# Patient Record
Sex: Male | Born: 1995
Health system: Southern US, Community
[De-identification: ages and names within clinical notes are randomized; demographics above are authoritative.]

## PROBLEM LIST (undated history)

## (undated) ENCOUNTER — Emergency Department (HOSPITAL_COMMUNITY): Admission: EM | Payer: Self-pay | Source: Home / Self Care

## (undated) HISTORY — PX: BRAIN SURGERY: SHX531

## (undated) HISTORY — PX: FRACTURE SURGERY: SHX138

---
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
## 2014-07-27 ENCOUNTER — Emergency Department (HOSPITAL_COMMUNITY)
Admission: EM | Admit: 2014-07-27 | Discharge: 2014-07-27 | Disposition: A | Discharge status: Home / Self Care | Payer: Self-pay | Attending: Emergency Medicine | Admitting: Emergency Medicine

## 2014-07-27 ENCOUNTER — Encounter (HOSPITAL_COMMUNITY): Payer: Self-pay | Admitting: *Deleted

## 2014-07-27 DIAGNOSIS — Z720 Tobacco use: Secondary | ICD-10-CM | POA: Insufficient documentation

## 2014-07-27 DIAGNOSIS — R000 Tachycardia, unspecified: Secondary | ICD-10-CM | POA: Insufficient documentation

## 2014-07-27 DIAGNOSIS — L0291 Cutaneous abscess, unspecified: Secondary | ICD-10-CM

## 2014-07-27 DIAGNOSIS — L03112 Cellulitis of left axilla: Secondary | ICD-10-CM | POA: Insufficient documentation

## 2014-07-27 DIAGNOSIS — L03119 Cellulitis of unspecified part of limb: Secondary | ICD-10-CM

## 2014-07-27 DIAGNOSIS — L03311 Cellulitis of abdominal wall: Secondary | ICD-10-CM | POA: Insufficient documentation

## 2014-07-27 DIAGNOSIS — L03113 Cellulitis of right upper limb: Secondary | ICD-10-CM | POA: Insufficient documentation

## 2014-07-27 MED ORDER — LIDOCAINE HCL (PF) 1 % IJ SOLN
INTRAMUSCULAR | Status: AC
  Administered 2014-07-27: 5 mL
  Filled 2014-07-27: qty 5

## 2014-07-27 MED ORDER — HYDROCODONE-ACETAMINOPHEN 5-325 MG PO TABS
ORAL_TABLET | ORAL | Status: AC
  Filled 2014-07-27: qty 2

## 2014-07-27 MED ORDER — SULFAMETHOXAZOLE-TRIMETHOPRIM 800-160 MG PO TABS: 1.0000 | ORAL_TABLET | Freq: Two times a day (BID) | ORAL | Status: AC

## 2014-07-27 MED ORDER — HYDROCODONE-ACETAMINOPHEN 5-325 MG PO TABS: 1.0000 | ORAL_TABLET | ORAL | Status: DC | PRN

## 2014-07-27 MED ORDER — SULFAMETHOXAZOLE-TRIMETHOPRIM 800-160 MG PO TABS
1.0000 | ORAL_TABLET | Freq: Once | ORAL | Status: AC
  Administered 2014-07-27: 1 via ORAL

## 2014-07-27 MED ORDER — IBUPROFEN 800 MG PO TABS: 800.0000 mg | ORAL_TABLET | Freq: Three times a day (TID) | ORAL | Status: DC

## 2014-07-27 MED ORDER — HYDROCODONE-ACETAMINOPHEN 5-325 MG PO TABS
2.0000 | ORAL_TABLET | Freq: Once | ORAL | Status: AC
  Administered 2014-07-27: 2 via ORAL

## 2014-07-27 MED ORDER — CEFTRIAXONE SODIUM 1 G IJ SOLR
INTRAMUSCULAR | Status: AC
  Filled 2014-07-27: qty 10

## 2014-07-27 MED ORDER — CIPROFLOXACIN HCL 500 MG PO TABS: 500.0000 mg | ORAL_TABLET | Freq: Two times a day (BID) | ORAL | Status: DC

## 2014-07-27 MED ORDER — IBUPROFEN 800 MG PO TABS
800.0000 mg | ORAL_TABLET | Freq: Once | ORAL | Status: AC
  Administered 2014-07-27: 800 mg via ORAL

## 2014-07-27 MED ORDER — SULFAMETHOXAZOLE-TRIMETHOPRIM 800-160 MG PO TABS
ORAL_TABLET | ORAL | Status: AC
  Filled 2014-07-27: qty 1

## 2014-07-27 MED ORDER — IBUPROFEN 800 MG PO TABS
ORAL_TABLET | ORAL | Status: AC
  Filled 2014-07-27: qty 1

## 2014-07-27 MED ORDER — CEFTRIAXONE SODIUM 1 G IJ SOLR
1.0000 g | Freq: Once | INTRAMUSCULAR | Status: AC
  Administered 2014-07-27: 1 g via INTRAMUSCULAR

## 2014-07-27 NOTE — Discharge Instructions (Signed)
Cellulitis  15 minutes daily until the wounds of your elbow, your arm, and on your side had healed. Please usual medications as ordered. Please see your primary physician, or return to the emergency department on February 26 for recheck of your cellulitis area of the elbow. Please change the dressing daily.                                                                                                                                                                       Cellulitis is an infection of the skin and the tissue under the skin. The infected area is usually red and tender. This happens most often in the arms and lower legs. HOME CARE   Take your antibiotic medicine as told. Finish the medicine even if you start to feel better.  Keep the infected arm or leg raised (elevated).  Put a warm cloth on the area up to 4 times per day.  Only take medicines as told by your doctor.  Keep all doctor visits as told. GET HELP IF:  You see red streaks on the skin coming from the infected area.  Your red area gets bigger or turns a dark color.  Your bone or joint under the infected area is painful after the skin heals.  Your infection comes back in the same area or different area.  You have a puffy (swollen) bump in the infected area.  You have new symptoms.  You have a fever. GET HELP RIGHT AWAY IF:   You feel very sleepy.  You throw up (vomit) or have watery poop (diarrhea).  You feel sick and have muscle aches and pains. MAKE SURE YOU:   Understand these instructions.  Will watch your condition.  Will get help right away if you are not doing well or get worse. Document Released: 11/07/2007 Document Revised: 10/05/2013 Document Reviewed: 08/06/2011 [facility] Patient Information 2015 [place], [place]. This information is not intended to replace advice given to you by your health care provider. Make sure you discuss any questions you have with your health care  provider.  Abscess An abscess (boil or furuncle) is an infected area on or under the skin. This area is filled with yellowish-white fluid (pus) and other material (debris). HOME CARE   Only take medicines as told by your doctor.  If you were given antibiotic medicine, take it as directed. Finish the medicine even if you start to feel better.  If gauze is used, follow your doctor's directions for changing the gauze.  To avoid spreading the infection:  Keep your abscess covered with a bandage.  Wash your hands well.  Do not share personal care items, towels, or whirlpools with others.  Avoid skin contact with others.  Keep  your skin and clothes clean around the abscess.  Keep all doctor visits as told. GET HELP RIGHT AWAY IF:   You have more pain, puffiness (swelling), or redness in the wound site.  You have more fluid or blood coming from the wound site.  You have muscle aches, chills, or you feel sick.  You have a fever. MAKE SURE YOU:   Understand these instructions.  Will watch your condition.  Will get help right away if you are not doing well or get worse. Document Released: 11/07/2007 Document Revised: 11/20/2011 Document Reviewed: 08/03/2011 [facility] Patient Information 2015 [place]. This information is not intended to replace advice given to you by your health care provider. Make sure you discuss any questions you have with your health care provider.

## 2014-07-27 NOTE — ED Notes (Addendum)
Pain, redness , swelling of rt elbow, with  Raised  Abscesses to lt chest and  And lt upper arm  Purulent drainage from rt elbow

## 2014-07-27 NOTE — ED Provider Notes (Signed)
CSN: [id number]     Arrival date & time 07/27/14  1125 History   First MD Initiated Contact with Patient 07/27/14 1200     Chief Complaint  Patient presents with  . Cellulitis     (Consider location/radiation/quality/duration/timing/severity/associated sxs/prior Treatment) HPI Comments: Patient is a 19 year old male who presents to the emergency department with a complaint of infected bumps at multiple places.  The patient states that approximately 2 weeks ago he noticed a red area on his right elbow. He attempted to squeeze it and open it, because he has done this before when these areas, up. He states now he has some swelling of the elbow and tenderness when he attempts to use his right arm. The patient has also noticed some smaller areas under the left arm, and also on the left flank area. The patient states he thinks he has had some temperature elevation, but he is not sure. He denies any shaking chills. He has not had nausea or vomiting. The patient states he has had these kinds of problems before, he is unsure if he's ever been diagnosed with methicillin-resistant staph arias.   The history is provided by the patient.    History reviewed. No pertinent past medical history. Past Surgical History  Procedure Laterality Date  . Brain surgery    . Fracture surgery     History reviewed. No pertinent family history. History  Substance Use Topics  . Smoking status: Current Every Day Smoker  . Smokeless tobacco: Not on file  . Alcohol Use: No    Review of Systems  Constitutional: Negative for activity change.       All ROS Neg except as noted in HPI  HENT: Negative for nosebleeds.   Eyes: Negative for photophobia and discharge.  Respiratory: Negative for cough, shortness of breath and wheezing.   Cardiovascular: Negative for chest pain and palpitations.  Gastrointestinal: Negative for abdominal pain and blood in stool.  Genitourinary: Negative for dysuria, frequency and hematuria.   Musculoskeletal: Negative for arthralgias and neck pain.  Skin: Positive for wound.  Neurological: Negative for dizziness, seizures and speech difficulty.  Psychiatric/Behavioral: Negative for hallucinations and confusion.      Allergies  Review of patient's allergies indicates no known allergies.  Home Medications   Prior to Admission medications   Not on File   BP 146/72 mmHg  Pulse 105  Temp(Src) 98.6 F (37 C) (Oral)  Resp 20  Ht 6' (1.829 m)  Wt 270 lb (122.471 kg)  BMI 36.61 kg/m2  SpO2 97% Physical Exam  Constitutional: He is oriented to person, place, and time. He appears well-developed and well-nourished.  Non-toxic appearance.  HENT:  Head: Normocephalic.  Right Ear: Tympanic membrane and external ear normal.  Left Ear: Tympanic membrane and external ear normal.  Eyes: EOM and lids are normal. Pupils are equal, round, and reactive to light.  Neck: Normal range of motion. Neck supple. Carotid bruit is not present.  Cardiovascular: Regular rhythm, normal heart sounds, intact distal pulses and normal pulses.  Tachycardia present.   Pulmonary/Chest: Breath sounds normal. No respiratory distress.  Abdominal: Soft. Bowel sounds are normal. There is no tenderness. There is no guarding.  Musculoskeletal: Normal range of motion.  There is a scabbed abscess area of the right elbow. There is increased redness around the area. There is no red streaking. But the elbow area is warm to touch. There is soreness with pronation and supination, as well as flexion and extension. There is full range  of motion of the shoulder, wrist, and fingers of the right hand.  Lymphadenopathy:       Head (right side): No submandibular adenopathy present.       Head (left side): No submandibular adenopathy present.    He has no cervical adenopathy.  Neurological: He is alert and oriented to person, place, and time. He has normal strength. No cranial nerve deficit or sensory deficit.  Skin: Skin  is warm and dry.  There are 2 raised red lesions of the left axilla, and 1 red raised abscess area of the left flank area. There no red streaks appreciated. These areas are tender to palpation.  Psychiatric: He has a normal mood and affect. His speech is normal.  Nursing note and vitals reviewed.   ED Course  Procedures (including critical care time) Labs Review Labs Reviewed - No data to display  Imaging Review No results found.   EKG Interpretation None      MDM  The examination suggest some early cellulitis involving the right elbow. The small abscess areas of the axilla and the left flank area are not candidates for incision and drainage. The pulse rate is 105, otherwise the vital signs are within normal limits. The patient is ambulatory without problem.  The plan at this time is for the patient to be treated with intramuscular Rocephin here in the emergency department. A prescription for Bactrim and Cipro given to the patient. Prescription for Norco given for use with pain. Patient advised to use warm tub soaks. He is to return in 3 days for recheck of the right elbow.   Final diagnoses:  None    *I have reviewed nursing notes, vital signs, and all appropriate lab and imaging results for this patient.**    Kathie Dike, PA-C 07/27/14 1346  Samuel Jester, DO 07/28/14 [phone]

## 2014-08-24 ENCOUNTER — Encounter (HOSPITAL_COMMUNITY): Payer: Self-pay | Admitting: *Deleted

## 2014-08-24 ENCOUNTER — Emergency Department (HOSPITAL_COMMUNITY)
Admission: EM | Admit: 2014-08-24 | Discharge: 2014-08-24 | Disposition: A | Discharge status: Home / Self Care | Payer: Self-pay | Attending: Emergency Medicine | Admitting: Emergency Medicine

## 2014-08-24 DIAGNOSIS — Z720 Tobacco use: Secondary | ICD-10-CM | POA: Insufficient documentation

## 2014-08-24 DIAGNOSIS — L0501 Pilonidal cyst with abscess: Secondary | ICD-10-CM

## 2014-08-24 DIAGNOSIS — Z792 Long term (current) use of antibiotics: Secondary | ICD-10-CM | POA: Insufficient documentation

## 2014-08-24 DIAGNOSIS — L0502 Pilonidal sinus with abscess: Secondary | ICD-10-CM | POA: Insufficient documentation

## 2014-08-24 MED ORDER — OXYCODONE-ACETAMINOPHEN 5-325 MG PO TABS
1.0000 | ORAL_TABLET | Freq: Once | ORAL | Status: AC
  Administered 2014-08-24: 1 via ORAL
  Filled 2014-08-24: qty 1

## 2014-08-24 MED ORDER — HYDROCODONE-ACETAMINOPHEN 5-325 MG PO TABS: 1.0000 | ORAL_TABLET | ORAL | Status: DC | PRN

## 2014-08-24 MED ORDER — LIDOCAINE HCL (PF) 1 % IJ SOLN
10.0000 mL | Freq: Once | INTRAMUSCULAR | Status: AC
  Administered 2014-08-24: 10 mL
  Filled 2014-08-24: qty 10

## 2014-08-24 MED ORDER — SULFAMETHOXAZOLE-TRIMETHOPRIM 800-160 MG PO TABS: 1.0000 | ORAL_TABLET | Freq: Two times a day (BID) | ORAL | Status: DC

## 2014-08-24 NOTE — Discharge Instructions (Signed)
°  Pilonidal Cyst, Care After °A pilonidal cyst occurs when hairs get trapped (ingrown) beneath the skin in the crease between the buttocks over your sacrum (the bone under that crease). Pilonidal cysts are most common in young men with a lot of body hair. When the cyst breaks(ruptured) or leaks, fluid from the cyst may cause burning and itching. If the cyst becomes infected, it causes a painful swelling filled with pus (abscess). The pus and trapped hairs need to be removed (often by lancing) so that the infection can heal. The word pilonidal means hair nest. °HOME CARE INSTRUCTIONS °If the pilonidal sinus was NOT DRAINING OR LANCED: °· Keep the area clean and dry. Bathe or shower daily. Wash the area well with a germ-killing soap. Hot tub baths may help prevent infection. Dry the area well with a towel. °· Avoid tight clothing in order to keep area as moisture-free as possible. °· Keep area between buttocks as free from hair as possible. A depilatory may be used. °· Take antibiotics as directed. °· Only take over-the-counter or prescription medicines for pain, discomfort, or fever as directed by your caregiver. °If the cyst WAS INFECTED AND NEEDED TO BE DRAINED: °· Your caregiver may have packed the wound with gauze to keep the wound open. This allows the wound to heal from the inside outward and continue to drain. °· Return as directed for a wound check. °· If you take tub baths or showers, repack the wound with gauze as directed following. Sponge baths are a good alternative. Sitz baths may be used three to four times a day or as directed. °· If an antibiotic was ordered to fight the infection, take as directed. °· Only take over-the-counter or prescription medicines for pain, discomfort, or fever as directed by your caregiver. °· If a drain was in place and removed, use sitz baths for 20 minutes 4 times per day. Clean the wound gently with mild unscented soap, pat dry, and then apply a dry dressing as  directed. °If you had surgery and IT WAS MARSUPIALIZED (LEFT OPEN): °· Your wound was packed with gauze to keep the wound open. This allows the wound to heal from the inside outwards and continue draining. The changing of the dressing regularly also helps keep the wound clean. °· Return as directed for a wound check. °· If you take tub baths or showers, repack the wound with gauze as directed following. Sponge baths are a good alternative. Sitz baths can also be used. This may be done three to four times a day or as directed. °· If an antibiotic was ordered to fight the infection, take as directed. °· Only take over-the-counter or prescription medicines for pain, discomfort, or fever as directed by your caregiver. °· If you had surgery and the wound was closed you may care for it as directed. This generally includes keeping it dry and clean and dressing it as directed. °SEEK MEDICAL CARE IF:  °· You have increased pain, swelling, redness, drainage, or bleeding from the area. °· You have a fever. °· You have muscles aches, dizziness, or a general ill feeling. °Document Released: 06/21/2006 Document Revised: 01/21/2013 Document Reviewed: 09/05/2006 °Exit[employer]® Patient Information ©2015 [employer]. This information is not intended to replace advice given to you by your health care provider. Make sure you discuss any questions you have with your health care provider. ° ° °

## 2014-08-24 NOTE — ED Notes (Signed)
Red swollen area to buttocks.

## 2014-08-25 NOTE — ED Provider Notes (Signed)
CSN: [id number]     Arrival date & time 08/24/14  1412 History   First MD Initiated Contact with Patient 08/24/14 1502     Chief Complaint  Patient presents with  . Cyst     (Consider location/radiation/quality/duration/timing/severity/associated sxs/prior Treatment) The history is provided by the patient.   Philip Bird is a 19 y.o. male with a 1 day history of tender, swollen, reddened area midline buttocks.  He endorses history of several prior abscess, right elbow, anterior groin, new at the current site.  There has been no drainage from the site despite warm soaks.  He denies fevers, chills, nausea, otherwise feels well.  No personal for close contact h/o mrsa.     History reviewed. No pertinent past medical history. Past Surgical History  Procedure Laterality Date  . Brain surgery    . Fracture surgery     History reviewed. No pertinent family history. History  Substance Use Topics  . Smoking status: Current Every Day Smoker    Types: Cigarettes  . Smokeless tobacco: Not on file  . Alcohol Use: No    Review of Systems  Constitutional: Negative for fever, chills and fatigue.  Gastrointestinal: Negative for nausea.  Musculoskeletal: Negative.   Skin: Positive for color change.  Neurological: Negative for numbness.      Allergies  Review of patient's allergies indicates no known allergies.  Home Medications   Prior to Admission medications   Medication Sig Start Date End Date Taking? Authorizing Provider  ciprofloxacin (CIPRO) 500 MG tablet Take 1 tablet (500 mg total) by mouth 2 (two) times daily. Patient not taking: Reported on 08/24/2014 07/27/14   Ivery QualeHobson Bryant, PA-C  HYDROcodone-acetaminophen (NORCO/VICODIN) 5-325 MG per tablet Take 1 tablet by mouth every 4 (four) hours as needed. 08/24/14   Burgess AmorJulie Santina Trillo, PA-C  ibuprofen (ADVIL,MOTRIN) 800 MG tablet Take 1 tablet (800 mg total) by mouth 3 (three) times daily. Patient not taking: Reported on 08/24/2014 07/27/14    Ivery QualeHobson Bryant, PA-C  sulfamethoxazole-trimethoprim (SEPTRA DS) 800-160 MG per tablet Take 1 tablet by mouth every 12 (twelve) hours. 08/24/14   Burgess AmorJulie Araiya Tilmon, PA-C   BP 126/72 mmHg  Pulse 98  Temp(Src) 98.3 F (36.8 C) (Oral)  Resp 16  Ht 6' (1.829 m)  Wt 280 lb (127.007 kg)  BMI 37.97 kg/m2  SpO2 96% Physical Exam  Constitutional: He is oriented to person, place, and time. He appears well-developed and well-nourished.  HENT:  Head: Normocephalic.  Cardiovascular: Normal rate.   Pulmonary/Chest: Effort normal.  Neurological: He is alert and oriented to person, place, and time. No sensory deficit.  Skin: There is erythema.  approx 2 cm area of fluctuant tenderness midline at pilonidal site.  No drainage, slight surrounding erythema.    ED Course  INCISION AND DRAINAGE Date/Time: 08/24/2014 4:00 PM Performed by: Burgess AmorIDOL, Verta Riedlinger Authorized by: Burgess AmorIDOL, Kahle Mcqueen Consent: Verbal consent obtained. Risks and benefits: risks, benefits and alternatives were discussed Consent given by: patient Patient identity confirmed: verbally with patient Time out: Immediately prior to procedure a "time out" was called to verify the correct patient, procedure, equipment, support staff and site/side marked as required. Type: abscess Body area: anogenital Location details: pilonidal Anesthesia: local infiltration Local anesthetic: lidocaine 1% without epinephrine Anesthetic total: 4 ml Patient sedated: no Scalpel size: 11 Incision type: single straight Complexity: simple Drainage: purulent Drainage amount: moderate Wound treatment: wound left open Packing material: none Patient tolerance: Patient tolerated the procedure well with no immediate complications   (including critical care  time) Labs Review Labs Reviewed  CULTURE, ROUTINE-ABSCESS    Imaging Review No results found.   EKG Interpretation None      MDM   Final diagnoses:  Pilonidal cyst with abscess    Pt placed on bactrim,  advised aggressive warm soaks to keep site open and draining.  Hydrocodone for pain relief. Prn f/u here for recheck if not healing/improving as anticipated.  Wound culture sent.  The patient appears reasonably screened and/or stabilized for discharge and I doubt any other medical condition or other [facility] requiring further screening, evaluation, or treatment in the ED at this time prior to discharge.     Burgess Amor, PA-C 08/25/14 0600  Pricilla Loveless, MD 08/28/14 [phone]

## 2014-08-28 LAB — CULTURE, ROUTINE-ABSCESS

## 2014-08-29 ENCOUNTER — Telehealth: Payer: Self-pay | Admitting: Emergency Medicine

## 2014-08-29 NOTE — Progress Notes (Signed)
ED Antimicrobial Stewardship Positive Culture Follow Up   Philip OvermanHunter Bird is an 19 y.o. male who [facility] with a chief complaint of  Chief Complaint  Patient presents with  . Cyst    Recent Results (from the past 720 hour(s))  Culture, routine-abscess     Status: None   Collection Time: 08/24/14  4:07 PM  Result Value Ref Range Status   Specimen Description ABSCESS PILONIDAL  Final   Special Requests NONE  Final   Gram Stain   Final    ABUNDANT WBC PRESENT,BOTH PMN AND MONONUCLEAR NO SQUAMOUS EPITHELIAL CELLS SEEN ABUNDANT GRAM POSITIVE COCCI IN PAIRS [facility]    Culture   Final    RARE VIRIDANS STREPTOCOCCUS Performed at [facility]    Report Status 08/28/2014 FINAL  Final    [x]  Treated with Septra, organism resistant to prescribed antimicrobial  Viridans Streptococcus is not a common cause of skin infections or abscesses.  Discussed with Teressa LowerVrinda Pickering, FNP.  Flow manager to attempt to contact patient. If his abscess is better he should continue with Septra. If his abscess is not better he should add antibiotic therapy with Keflex.  New antibiotic prescription: Cephalexin 500mg  PO TID x 10 days   ED Provider: Teressa LowerVrinda Pickering, FNP   Sallee Provencalurner, Reizy Dunlow S 08/29/2014, 11:43 AM Infectious Diseases Pharmacist Phone# [phone]

## 2014-08-30 ENCOUNTER — Telehealth (HOSPITAL_COMMUNITY): Payer: Self-pay

## 2014-08-30 NOTE — ED Notes (Signed)
Spoke with pt. Informed of labs. Pt states he is doing better. No medication called in for pt.

## 2014-10-12 ENCOUNTER — Emergency Department (HOSPITAL_COMMUNITY)
Admission: EM | Admit: 2014-10-12 | Discharge: 2014-10-12 | Disposition: A | Discharge status: Home / Self Care | Payer: Self-pay | Attending: Emergency Medicine | Admitting: Emergency Medicine

## 2014-10-12 ENCOUNTER — Encounter (HOSPITAL_COMMUNITY): Payer: Self-pay

## 2014-10-12 DIAGNOSIS — Z720 Tobacco use: Secondary | ICD-10-CM | POA: Insufficient documentation

## 2014-10-12 DIAGNOSIS — L02412 Cutaneous abscess of left axilla: Secondary | ICD-10-CM | POA: Insufficient documentation

## 2014-10-12 MED ORDER — LIDOCAINE HCL (PF) 1 % IJ SOLN
INTRAMUSCULAR | Status: AC
  Administered 2014-10-12: 15:00:00
  Filled 2014-10-12: qty 5

## 2014-10-12 MED ORDER — CEPHALEXIN 500 MG PO CAPS: 500.0000 mg | ORAL_CAPSULE | Freq: Four times a day (QID) | ORAL | Status: DC

## 2014-10-12 MED ORDER — HYDROCODONE-ACETAMINOPHEN 5-325 MG PO TABS: 1.0000 | ORAL_TABLET | ORAL | Status: DC | PRN

## 2014-10-12 MED ORDER — POVIDONE-IODINE 10 % EX SOLN
CUTANEOUS | Status: AC
  Administered 2014-10-12: 15:00:00
  Filled 2014-10-12: qty 118

## 2014-10-12 MED ORDER — SULFAMETHOXAZOLE-TRIMETHOPRIM 800-160 MG PO TABS: 1.0000 | ORAL_TABLET | Freq: Two times a day (BID) | ORAL | Status: AC

## 2014-10-12 NOTE — ED Notes (Signed)
Boil to left axilla x5 days.

## 2014-10-12 NOTE — Discharge Instructions (Signed)

## 2014-10-14 NOTE — ED Provider Notes (Signed)
CSN: [id number]     Arrival date & time 10/12/14  1328 History   First MD Initiated Contact with Patient 10/12/14 1342     Chief Complaint  Patient presents with  . Recurrent Skin Infections     (Consider location/radiation/quality/duration/timing/severity/associated sxs/prior Treatment) Patient is a 19 y.o. male presenting with abscess. The history is provided by the patient.  Abscess Location:  Shoulder/arm Shoulder/arm abscess location:  L axilla Size:  2 cm Abscess quality: fluctuance, induration, painful and redness   Abscess quality: not draining   Red streaking: no   Duration:  5 days Progression:  Worsening Pain details:    Quality:  Sharp and throbbing   Severity:  Moderate   Duration:  3 days   Timing:  Constant   Progression:  Worsening Chronicity:  Recurrent (Pt has history of frequent abscesses, left axilla is a new site) Relieved by:  Nothing Worsened by:  Nothing tried Ineffective treatments:  Warm compresses and NSAIDs Associated symptoms: no fever, no nausea and no vomiting   Risk factors: prior abscess   Risk factors: no hx of MRSA     History reviewed. No pertinent past medical history. Past Surgical History  Procedure Laterality Date  . Brain surgery    . Fracture surgery     No family history on file. History  Substance Use Topics  . Smoking status: Current Every Day Smoker    Types: Cigarettes  . Smokeless tobacco: Not on file  . Alcohol Use: No    Review of Systems  Constitutional: Negative for fever and chills.  Respiratory: Negative for shortness of breath and wheezing.   Gastrointestinal: Negative for nausea and vomiting.  Skin: Positive for color change.       Negative except as mentioned in HPI.    Neurological: Negative for numbness.      Allergies  Review of patient's allergies indicates no known allergies.  Home Medications   Prior to Admission medications   Medication Sig Start Date End Date Taking? Authorizing  Provider  cephALEXin (KEFLEX) 500 MG capsule Take 1 capsule (500 mg total) by mouth 4 (four) times daily. 10/12/14   Burgess AmorJulie Arnol Mcgibbon, PA-C  ciprofloxacin (CIPRO) 500 MG tablet Take 1 tablet (500 mg total) by mouth 2 (two) times daily. Patient not taking: Reported on 08/24/2014 07/27/14   Ivery QualeHobson Bryant, PA-C  HYDROcodone-acetaminophen (NORCO/VICODIN) 5-325 MG per tablet Take 1 tablet by mouth every 4 (four) hours as needed. 10/12/14   Burgess AmorJulie Kendal Ghazarian, PA-C  ibuprofen (ADVIL,MOTRIN) 800 MG tablet Take 1 tablet (800 mg total) by mouth 3 (three) times daily. Patient not taking: Reported on 08/24/2014 07/27/14   Ivery QualeHobson Bryant, PA-C  sulfamethoxazole-trimethoprim (BACTRIM DS,SEPTRA DS) 800-160 MG per tablet Take 1 tablet by mouth 2 (two) times daily. 10/12/14 10/19/14  Burgess AmorJulie Adaja Wander, PA-C   BP 126/87 mmHg  Pulse 103  Temp(Src) 98.7 F (37.1 C) (Oral)  Resp 20  Ht 6' (1.829 m)  Wt 260 lb (117.935 kg)  BMI 35.25 kg/m2  SpO2 99% Physical Exam  Constitutional: He appears well-developed and well-nourished. No distress.  HENT:  Head: Normocephalic.  Neck: Neck supple.  Cardiovascular: Normal rate.   Pulmonary/Chest: Effort normal. He has no wheezes.  Musculoskeletal: Normal range of motion. He exhibits no edema.  Skin: Skin is warm and dry. There is erythema.  2 cm tender induration with a 4 cm total area round erythema at the site, no active drainage.     ED Course  Procedures (including critical care time)  INCISION AND DRAINAGE Performed by: Burgess AmorIDOL, Ritter Helsley Consent: Verbal consent obtained. Risks and benefits: risks, benefits and alternatives were discussed Type: abscess  Body area: left axilla  Anesthesia: local infiltration  Incision was made with a scalpel.  Local anesthetic: lidocaine 1% without epinephrine  Anesthetic total: 6  ml  Complexity: complex Blunt dissection to break up loculations  Drainage: purulent  Drainage amount: moderate  Packing material: no packing  Patient tolerance:  Patient tolerated the procedure well with no immediate complications.    Labs Review Labs Reviewed - No data to display  Imaging Review No results found.   EKG Interpretation None      MDM   Final diagnoses:  Abscess of axilla, left    Prior cultures reviewed, neg mrsa.  He was placed on bactrim and keflex, hydrocodone for pain relief.  Warm soaks bid followed by soap and water wash, dressing.  Recheck here or by pcp for any worsening or persistent sx.  The patient appears reasonably screened and/or stabilized for discharge and I doubt any other medical condition or other [facility] requiring further screening, evaluation, or treatment in the ED at this time prior to discharge.     Burgess AmorJulie Zubayr Bednarczyk, PA-C 10/14/14 1440  Eber HongBrian Miller, MD 10/14/14 2147

## 2016-01-26 ENCOUNTER — Other Ambulatory Visit: Payer: Self-pay | Admitting: Physical Medicine and Rehabilitation

## 2016-01-26 ENCOUNTER — Ambulatory Visit
Admission: RE | Admit: 2016-01-26 | Discharge: 2016-01-26 | Disposition: A | Discharge status: Home / Self Care | Payer: No Typology Code available for payment source | Source: Ambulatory Visit | Attending: Physical Medicine and Rehabilitation | Admitting: Physical Medicine and Rehabilitation

## 2016-01-26 DIAGNOSIS — Z0000 Encounter for general adult medical examination without abnormal findings: Secondary | ICD-10-CM

## 2017-03-25 IMAGING — CR DG CHEST 1V
1 series · 1 of 1 positions shown · non-contrast
Comparison: None.

CLINICAL DATA: Routine physical examination.

EXAM:
CHEST 1 VIEW

[view not recorded]
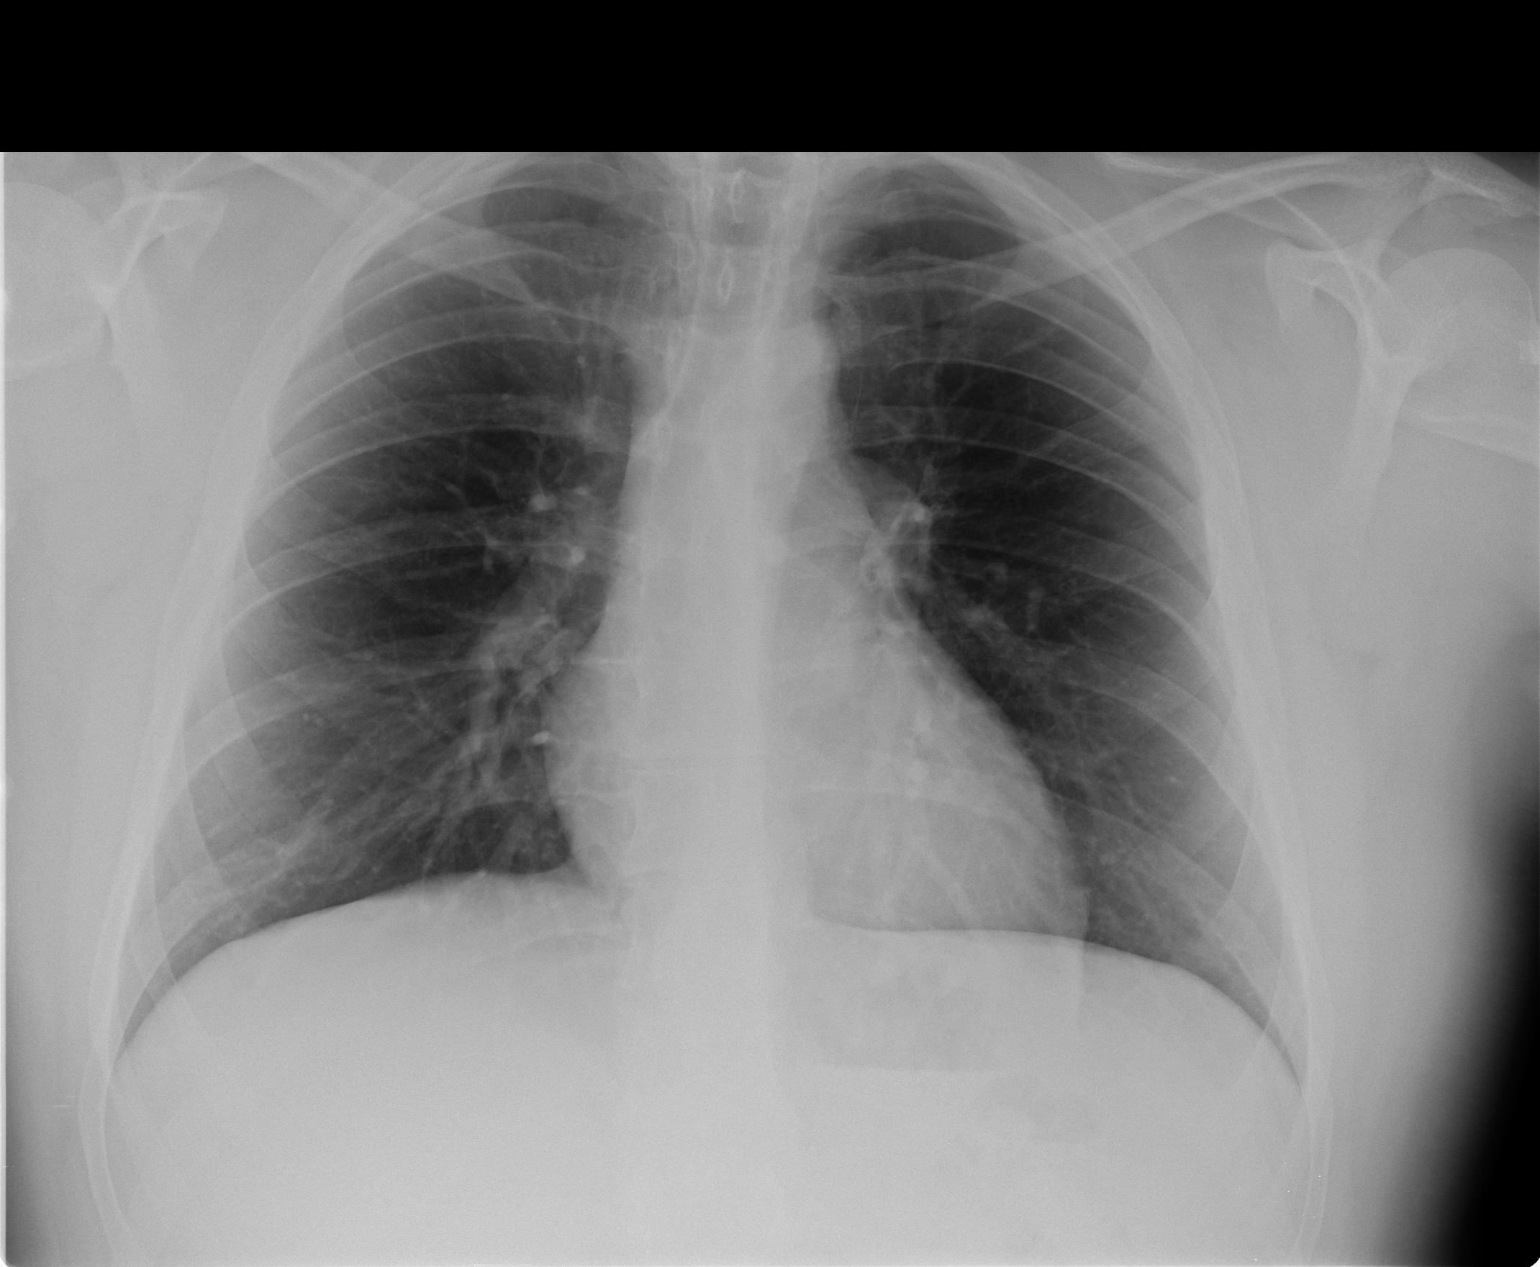

[1 of 1 positions shown; findings below may reference images not displayed]

FINDINGS: The heart size and mediastinal contours are within normal limits.
Both lungs are clear. No pneumothorax or pleural effusion is noted.
The visualized skeletal structures are unremarkable.
IMPRESSION: No acute cardiopulmonary abnormality seen.

## 2017-04-26 ENCOUNTER — Emergency Department (HOSPITAL_COMMUNITY)
Admission: EM | Admit: 2017-04-26 | Discharge: 2017-04-26 | Disposition: A | Discharge status: Home / Self Care | Payer: 59 | Attending: Emergency Medicine | Admitting: Emergency Medicine

## 2017-04-26 ENCOUNTER — Encounter (HOSPITAL_COMMUNITY): Payer: Self-pay | Admitting: Emergency Medicine

## 2017-04-26 DIAGNOSIS — K625 Hemorrhage of anus and rectum: Secondary | ICD-10-CM | POA: Diagnosis not present

## 2017-04-26 DIAGNOSIS — Y999 Unspecified external cause status: Secondary | ICD-10-CM | POA: Insufficient documentation

## 2017-04-26 DIAGNOSIS — S0990XA Unspecified injury of head, initial encounter: Secondary | ICD-10-CM

## 2017-04-26 DIAGNOSIS — S098XXA Other specified injuries of head, initial encounter: Secondary | ICD-10-CM | POA: Insufficient documentation

## 2017-04-26 DIAGNOSIS — Z79899 Other long term (current) drug therapy: Secondary | ICD-10-CM | POA: Diagnosis not present

## 2017-04-26 DIAGNOSIS — Y929 Unspecified place or not applicable: Secondary | ICD-10-CM | POA: Insufficient documentation

## 2017-04-26 DIAGNOSIS — Z87891 Personal history of nicotine dependence: Secondary | ICD-10-CM | POA: Insufficient documentation

## 2017-04-26 DIAGNOSIS — B354 Tinea corporis: Secondary | ICD-10-CM | POA: Diagnosis not present

## 2017-04-26 DIAGNOSIS — Y9389 Activity, other specified: Secondary | ICD-10-CM | POA: Insufficient documentation

## 2017-04-26 LAB — CBC WITH DIFFERENTIAL/PLATELET
BASOS ABS: 0 10*3/uL (ref 0.0–0.1)
Basophils Relative: 1 %
Eosinophils Absolute: 0.2 10*3/uL (ref 0.0–0.7)
Eosinophils Relative: 3 %
HCT: 41.6 % (ref 39.0–52.0)
HEMOGLOBIN: 13.7 g/dL (ref 13.0–17.0)
LYMPHS ABS: 1.9 10*3/uL (ref 0.7–4.0)
LYMPHS PCT: 26 %
MCH: 28.6 pg (ref 26.0–34.0)
MCHC: 32.9 g/dL (ref 30.0–36.0)
MCV: 86.8 fL (ref 78.0–100.0)
Monocytes Absolute: 0.6 10*3/uL (ref 0.1–1.0)
Monocytes Relative: 9 %
Neutro Abs: 4.3 10*3/uL (ref 1.7–7.7)
Neutrophils Relative %: 61 %
PLATELETS: 207 10*3/uL (ref 150–400)
RBC: 4.79 MIL/uL (ref 4.22–5.81)
RDW: 12.4 % (ref 11.5–15.5)
WBC: 7 10*3/uL (ref 4.0–10.5)

## 2017-04-26 LAB — COMPREHENSIVE METABOLIC PANEL
ALT: 33 U/L (ref 17–63)
AST: 20 U/L (ref 15–41)
Albumin: 3.5 g/dL (ref 3.5–5.0)
Alkaline Phosphatase: 67 U/L (ref 38–126)
Anion gap: 7 (ref 5–15)
BUN: 17 mg/dL (ref 6–20)
CALCIUM: 8.8 mg/dL — AB (ref 8.9–10.3)
CHLORIDE: 104 mmol/L (ref 101–111)
CO2: 26 mmol/L (ref 22–32)
Creatinine, Ser: 0.83 mg/dL (ref 0.61–1.24)
Glucose, Bld: 97 mg/dL (ref 65–99)
Potassium: 4.1 mmol/L (ref 3.5–5.1)
SODIUM: 137 mmol/L (ref 135–145)
Total Bilirubin: 0.6 mg/dL (ref 0.3–1.2)
Total Protein: 6.8 g/dL (ref 6.5–8.1)

## 2017-04-26 LAB — URINALYSIS, ROUTINE W REFLEX MICROSCOPIC
Bilirubin Urine: NEGATIVE
GLUCOSE, UA: NEGATIVE mg/dL
Hgb urine dipstick: NEGATIVE
Ketones, ur: NEGATIVE mg/dL
LEUKOCYTES UA: NEGATIVE
NITRITE: NEGATIVE
Protein, ur: NEGATIVE mg/dL
SPECIFIC GRAVITY, URINE: 1.02 (ref 1.005–1.030)
pH: 7 (ref 5.0–8.0)

## 2017-04-26 LAB — POC OCCULT BLOOD, ED: Fecal Occult Bld: NEGATIVE

## 2017-04-26 LAB — TYPE AND SCREEN
ABO/RH(D): AB NEG
Antibody Screen: NEGATIVE

## 2017-04-26 MED ORDER — CLOTRIMAZOLE 1 % EX CREA: TOPICAL_CREAM | CUTANEOUS | 1 refills | Status: DC

## 2017-04-26 MED ORDER — OMEPRAZOLE 20 MG PO CPDR: 20.0000 mg | DELAYED_RELEASE_CAPSULE | Freq: Every day | ORAL | 0 refills | Status: DC
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
## 2017-04-26 NOTE — Discharge Instructions (Signed)
Your lab tests today as discussed are normal.  It is unclear where this bleeding originated, but I suspect a lower bowel source, possibly an internal hemorrhoid.  However given your problems with severe heartburn it is important for you to follow-up with a gastroenterologist to ensure that you are not developing an ulcer or any other problems from this condition.  Use the medications as prescribed.  He had been prescribed an antifungal cream for your rash which I suspect is a fungal ringworm infection.  You need to use this medication twice daily for a week after these lesions have cleared to make sure the fungal infection is completely gone.  However if this is not improving after 2 weeks use he should get rechecked.  See the resources above for obtaining a primary doctor.  In the interim, return here for any worsened or return of your bleeding.

## 2018-02-06 ENCOUNTER — Other Ambulatory Visit: Payer: Self-pay

## 2018-02-06 ENCOUNTER — Encounter (HOSPITAL_COMMUNITY): Payer: Self-pay | Admitting: Emergency Medicine

## 2018-02-06 ENCOUNTER — Emergency Department (HOSPITAL_COMMUNITY)
Admission: EM | Admit: 2018-02-06 | Discharge: 2018-02-06 | Disposition: A | Discharge status: Home / Self Care | Payer: 59 | Attending: Emergency Medicine | Admitting: Emergency Medicine

## 2018-02-06 DIAGNOSIS — Z79899 Other long term (current) drug therapy: Secondary | ICD-10-CM | POA: Diagnosis not present

## 2018-02-06 DIAGNOSIS — Z87891 Personal history of nicotine dependence: Secondary | ICD-10-CM | POA: Diagnosis not present

## 2018-02-06 DIAGNOSIS — B349 Viral infection, unspecified: Secondary | ICD-10-CM | POA: Diagnosis not present

## 2018-02-06 DIAGNOSIS — R509 Fever, unspecified: Secondary | ICD-10-CM | POA: Diagnosis present

## 2018-02-06 LAB — GROUP A STREP BY PCR: Group A Strep by PCR: NOT DETECTED

## 2018-02-06 NOTE — ED Provider Notes (Signed)
[facility] EMERGENCY DEPARTMENT Provider Note   CSN: [id number] Arrival date & time: 02/06/18  0025     History   Chief Complaint Chief Complaint  Patient presents with  . Multiple Complaints    HPI Philip Bird is a 22 y.o. male.  Patient presents with a 1 day history of body aches and fever.  States he felt ill this morning when he woke up around 5 AM.  He had pain in his arms, legs and shoulders.  He felt ill all day and was lying on the couch for most of the day.  Reports fever up to 104 at home.  Had Tylenol at home this evening and now feels better.  Afebrile on arrival.  His body aches have resolved.  He denies any other symptoms.  No headache, runny nose, sore throat or cough.  No abdominal pain, nausea or vomiting.  Did have one episode of diarrhea today.  Has had multiple sick contacts at home.  Denies any chronic medical problems.  Denies any IV drug abuse.  Denies any significant heat exposure or exertional activities in the heat.  The history is provided by the patient.    History reviewed. No pertinent past medical history.  There are no active problems to display for this patient.   Past Surgical History:  Procedure Laterality Date  . BRAIN SURGERY    . FRACTURE SURGERY          Home Medications    Prior to Admission medications   Medication Sig Start Date End Date Taking? Authorizing Provider  cephALEXin (KEFLEX) 500 MG capsule Take 1 capsule (500 mg total) by mouth 4 (four) times daily. 10/12/14   Burgess Amor, PA-C  ciprofloxacin (CIPRO) 500 MG tablet Take 1 tablet (500 mg total) by mouth 2 (two) times daily. Patient not taking: Reported on 08/24/2014 07/27/14   Ivery Quale, PA-C  clotrimazole (LOTRIMIN) 1 % cream Apply to affected area 2 times daily 04/26/17   Burgess Amor, PA-C  HYDROcodone-acetaminophen (NORCO/VICODIN) 5-325 MG per tablet Take 1 tablet by mouth every 4 (four) hours as needed. 10/12/14   Burgess Amor, PA-C  ibuprofen (ADVIL,MOTRIN) 800  MG tablet Take 1 tablet (800 mg total) by mouth 3 (three) times daily. Patient not taking: Reported on 08/24/2014 07/27/14   Ivery Quale, PA-C  omeprazole (PRILOSEC) 20 MG capsule Take 1 capsule (20 mg total) by mouth daily. 04/26/17   Burgess Amor, PA-C    Family History No family history on file.  Social History Social History   Tobacco Use  . Smoking status: Former Smoker    Types: Cigarettes  . Smokeless tobacco: Never Used  Substance Use Topics  . Alcohol use: No  . Drug use: No     Allergies   Penicillins   Review of Systems Review of Systems  Constitutional: Positive for activity change, appetite change, chills and fever.  HENT: Negative for congestion, nosebleeds, postnasal drip and sore throat.   Eyes: Negative for visual disturbance.  Respiratory: Negative for cough, chest tightness and shortness of breath.   Cardiovascular: Negative for chest pain.  Gastrointestinal: Positive for diarrhea. Negative for abdominal pain and vomiting.  Genitourinary: Negative for dysuria, flank pain and hematuria.  Musculoskeletal: Negative for arthralgias and myalgias.  Neurological: Positive for weakness. Negative for dizziness and headaches.   all other systems are negative except as noted in the HPI and PMH.     Physical Exam Updated Vital Signs BP (!) 145/97 (BP Location: Left Arm)  Pulse 83   Temp 98.8 F (37.1 C) (Oral)   Resp 15   Ht 6' (1.829 m)   Wt 115.7 kg   SpO2 97%   BMI 34.58 kg/m   Physical Exam  Constitutional: He is oriented to person, place, and time. He appears well-developed and well-nourished. No distress.  Appears well and nontoxic  HENT:  Head: Normocephalic and atraumatic.  Mouth/Throat: Oropharynx is clear and moist. No oropharyngeal exudate.  Mild OP erythema. No asymmetry   Eyes: Pupils are equal, round, and reactive to light. Conjunctivae and EOM are normal.  Neck: Normal range of motion. Neck supple.  No meningismus.  Cardiovascular:  Normal rate, regular rhythm, normal heart sounds and intact distal pulses.  No murmur heard. Pulmonary/Chest: Effort normal and breath sounds normal. No respiratory distress. He exhibits no tenderness.  Abdominal: Soft. There is no tenderness. There is no rebound and no guarding.  Musculoskeletal: Normal range of motion. He exhibits no edema or tenderness.  Neurological: He is alert and oriented to person, place, and time. No cranial nerve deficit. He exhibits normal muscle tone. Coordination normal.  No ataxia on finger to nose bilaterally. No pronator drift. 5/5 strength throughout. CN 2-12 intact.Equal grip strength. Sensation intact.   Skin: Skin is warm. Capillary refill takes less than 2 seconds. No rash noted.  Psychiatric: He has a normal mood and affect. His behavior is normal.  Nursing note and vitals reviewed.    ED Treatments / Results  Labs (all labs ordered are listed, but only abnormal results are displayed) Labs Reviewed  GROUP A STREP BY PCR    EKG None  Radiology No results found.  Procedures Procedures (including critical care time)  Medications Ordered in ED Medications - No data to display   Initial Impression / Assessment and Plan / ED Course  I have reviewed the triage vital signs and the nursing notes.  Pertinent labs & imaging results that were available during my care of the patient were reviewed by me and considered in my medical decision making (see chart for details).    Body aches with fever.  Now resolved.  Patient is well-appearing with no complaints and stable vitals and afebrile.  Rapid strep is negative.  Patient feels well and has no complaints.  He is nontoxic-appearing.  Discussed myalgias could be caused by electrolyte imbalance but would unlikely improve this quickly.  He declines any checking of his electrolytes or CK. He is anxious to leave.  Discussed that symptoms are likely viral in origin.  Possibly influenza or another kind of  virus.  Advised p.o. hydration, antipyretics, PCP follow-up.  Return precautions discussed. Final Clinical Impressions(s) / ED Diagnoses   Final diagnoses:  Viral syndrome    ED Discharge Orders    None       Kamilo Och, Jeannett Senior, MD 02/06/18 [phone]

## 2018-02-06 NOTE — ED Triage Notes (Signed)
Pt C/O generalized pain in his arms and legs with fever that began this morning around 0500. PT denies N/V.

## 2018-02-06 NOTE — Discharge Instructions (Addendum)
As we discussed your symptoms are likely viral, possibly influenza.  You declined blood testing today.  Keep yourself hydrated.  Use Tylenol or ibuprofen every 3 hours as needed for aches and fever.  Establish care with a primary doctor.  Return to the ED if you develop new or worsening symptoms.

## 2018-06-05 DIAGNOSIS — J111 Influenza due to unidentified influenza virus with other respiratory manifestations: Secondary | ICD-10-CM | POA: Diagnosis not present

## 2018-06-05 DIAGNOSIS — R21 Rash and other nonspecific skin eruption: Secondary | ICD-10-CM | POA: Diagnosis not present

## 2018-07-08 DIAGNOSIS — G253 Myoclonus: Secondary | ICD-10-CM | POA: Diagnosis not present

## 2018-07-08 DIAGNOSIS — R569 Unspecified convulsions: Secondary | ICD-10-CM | POA: Diagnosis not present

## 2018-09-19 ENCOUNTER — Emergency Department (HOSPITAL_COMMUNITY)
Admission: EM | Admit: 2018-09-19 | Discharge: 2018-09-19 | Disposition: A | Discharge status: Home / Self Care | Payer: 59 | Attending: Emergency Medicine | Admitting: Emergency Medicine

## 2018-09-19 ENCOUNTER — Other Ambulatory Visit: Payer: Self-pay

## 2018-09-19 ENCOUNTER — Encounter (HOSPITAL_COMMUNITY): Payer: Self-pay | Admitting: Emergency Medicine

## 2018-09-19 DIAGNOSIS — R197 Diarrhea, unspecified: Secondary | ICD-10-CM | POA: Diagnosis not present

## 2018-09-19 DIAGNOSIS — R109 Unspecified abdominal pain: Secondary | ICD-10-CM | POA: Diagnosis present

## 2018-09-19 DIAGNOSIS — Z87891 Personal history of nicotine dependence: Secondary | ICD-10-CM | POA: Insufficient documentation

## 2018-09-19 DIAGNOSIS — R112 Nausea with vomiting, unspecified: Secondary | ICD-10-CM | POA: Insufficient documentation

## 2018-09-19 DIAGNOSIS — R1030 Lower abdominal pain, unspecified: Secondary | ICD-10-CM | POA: Diagnosis not present

## 2018-09-19 LAB — CBC WITH DIFFERENTIAL/PLATELET
Abs Immature Granulocytes: 0.03 10*3/uL (ref 0.00–0.07)
Basophils Absolute: 0 10*3/uL (ref 0.0–0.1)
Basophils Relative: 0 %
Eosinophils Absolute: 0.1 10*3/uL (ref 0.0–0.5)
Eosinophils Relative: 1 %
HCT: 47.2 % (ref 39.0–52.0)
Hemoglobin: 16 g/dL (ref 13.0–17.0)
Immature Granulocytes: 0 %
Lymphocytes Relative: 11 %
Lymphs Abs: 1.1 10*3/uL (ref 0.7–4.0)
MCH: 28.7 pg (ref 26.0–34.0)
MCHC: 33.9 g/dL (ref 30.0–36.0)
MCV: 84.7 fL (ref 80.0–100.0)
Monocytes Absolute: 1 10*3/uL (ref 0.1–1.0)
Monocytes Relative: 10 %
Neutro Abs: 7.6 10*3/uL (ref 1.7–7.7)
Neutrophils Relative %: 78 %
Platelets: 223 10*3/uL (ref 150–400)
RBC: 5.57 MIL/uL (ref 4.22–5.81)
RDW: 11.9 % (ref 11.5–15.5)
WBC: 9.8 10*3/uL (ref 4.0–10.5)
nRBC: 0 % (ref 0.0–0.2)

## 2018-09-19 LAB — URINALYSIS, ROUTINE W REFLEX MICROSCOPIC
Bilirubin Urine: NEGATIVE
Glucose, UA: NEGATIVE mg/dL
Hgb urine dipstick: NEGATIVE
Ketones, ur: NEGATIVE mg/dL
Leukocytes,Ua: NEGATIVE
Nitrite: NEGATIVE
Protein, ur: NEGATIVE mg/dL
Specific Gravity, Urine: 1.027 (ref 1.005–1.030)
pH: 5 (ref 5.0–8.0)

## 2018-09-19 LAB — COMPREHENSIVE METABOLIC PANEL
ALT: 45 U/L — ABNORMAL HIGH (ref 0–44)
AST: 26 U/L (ref 15–41)
Albumin: 4.1 g/dL (ref 3.5–5.0)
Alkaline Phosphatase: 79 U/L (ref 38–126)
Anion gap: 10 (ref 5–15)
BUN: 14 mg/dL (ref 6–20)
CO2: 22 mmol/L (ref 22–32)
Calcium: 8.8 mg/dL — ABNORMAL LOW (ref 8.9–10.3)
Chloride: 105 mmol/L (ref 98–111)
Creatinine, Ser: 0.92 mg/dL (ref 0.61–1.24)
GFR calc Af Amer: >60 mL/min (ref >60)
GFR calc non Af Amer: >60 mL/min (ref >60)
Glucose, Bld: 103 mg/dL — ABNORMAL HIGH (ref 70–99)
Potassium: 3.9 mmol/L (ref 3.5–5.1)
Sodium: 137 mmol/L (ref 135–145)
Total Bilirubin: 0.7 mg/dL (ref 0.3–1.2)
Total Protein: 7.7 g/dL (ref 6.5–8.1)

## 2018-09-19 MED ORDER — ONDANSETRON 4 MG PO TBDP: 4.0000 mg | ORAL_TABLET | Freq: Three times a day (TID) | ORAL | 0 refills | Status: DC | PRN

## 2018-09-19 MED ORDER — SODIUM CHLORIDE 0.9 % IV BOLUS
1000.0000 mL | Freq: Once | INTRAVENOUS | Status: AC
  Administered 2018-09-19: 1000 mL via INTRAVENOUS

## 2018-09-19 MED ORDER — LOPERAMIDE HCL 2 MG PO CAPS: 2.0000 mg | ORAL_CAPSULE | Freq: Four times a day (QID) | ORAL | 0 refills | Status: DC | PRN

## 2018-09-19 MED ORDER — ONDANSETRON HCL 4 MG/2ML IJ SOLN
4.0000 mg | Freq: Once | INTRAMUSCULAR | Status: AC
  Administered 2018-09-19: 4 mg via INTRAVENOUS
  Filled 2018-09-19: qty 2

## 2018-09-19 NOTE — ED Notes (Signed)
When pt provided note for out of work until the 20th he replies "sweet"  Denies any discomfort at this time

## 2018-09-19 NOTE — ED Notes (Signed)
Pt called out of work due to ab pain and D   Needs work excuse

## 2018-09-19 NOTE — ED Triage Notes (Signed)
Patient complaining of abdominal pain and diarrhea starting last night. States "I called out of work and they told me before I came back I needed a doctor's note."

## 2018-09-19 NOTE — ED Provider Notes (Signed)
[facility]   CSN: [id number] Arrival date & time: 09/19/18  1603    History   Chief Complaint Chief Complaint  Patient presents with  . Abdominal Pain    HPI Philip Bird is a 23 y.o. male with no significant past medical history presenting with a 1 day history nausea without emesis along with low anterior abdominal cramping improved with diarrhea which started late last night.  He reports his 25 year old son had similar symptoms also starting yesterday, but also with emesis.  Pt denies fevers, cough, cp, sob.  Also denies dysuria or reduced urination, dizziness or weakness.  His stools have bene a mixture of liquid and solids, non bloody. Reports 4 episodes since sx began.  Denies any recent travel, consumption of improperly cooked foods, denies recent abx use.  He has had no tx prior to arrival.  He states he has to have a work note to return to work.      The history is provided by the patient.    History reviewed. No pertinent past medical history.  There are no active problems to display for this patient.   Past Surgical History:  Procedure Laterality Date  . BRAIN SURGERY    . FRACTURE SURGERY          Home Medications    Prior to Admission medications   Medication Sig Start Date End Date Taking? Authorizing Provider  loperamide (IMODIUM) 2 MG capsule Take 1 capsule (2 mg total) by mouth 4 (four) times daily as needed for diarrhea or loose stools. 09/19/18   Kamori Kitchens, Raynelle FanningJulie, PA-C  ondansetron (ZOFRAN ODT) 4 MG disintegrating tablet Take 1 tablet (4 mg total) by mouth every 8 (eight) hours as needed for nausea or vomiting. 09/19/18   Burgess AmorIdol, Mildred Bollard, PA-C    Family History History reviewed. No pertinent family history.  Social History Social History   Tobacco Use  . Smoking status: Former Smoker    Types: Cigarettes  . Smokeless tobacco: Never Used  Substance Use Topics  . Alcohol use: Yes    Comment: rarely  . Drug use: No     Allergies   Penicillins   Review of Systems Review of Systems  Constitutional: Positive for appetite change. Negative for chills and fever.  HENT: Negative for congestion and sore throat.   Eyes: Negative.   Respiratory: Negative for chest tightness and shortness of breath.   Cardiovascular: Negative for chest pain.  Gastrointestinal: Positive for abdominal pain, diarrhea and nausea. Negative for vomiting.  Genitourinary: Negative.  Negative for decreased urine volume, dysuria and frequency.  Musculoskeletal: Negative for arthralgias, joint swelling and neck pain.  Skin: Negative.  Negative for rash and wound.  Neurological: Negative for dizziness, weakness, light-headedness, numbness and headaches.  Psychiatric/Behavioral: Negative.      Physical Exam Updated Vital Signs BP 124/78   Pulse 79   Temp 98.3 F (36.8 C) (Oral)   Resp 20   Ht 6' (1.829 m)   Wt 124.7 kg   SpO2 100%   BMI 37.30 kg/m   Physical Exam Vitals signs and nursing note reviewed.  Constitutional:      General: He is not in acute distress.    Appearance: He is well-developed. He is not ill-appearing.  HENT:     Head: Normocephalic and atraumatic.  Eyes:     Conjunctiva/sclera: Conjunctivae normal.  Neck:     Musculoskeletal: Normal range of motion.  Cardiovascular:     Rate and  Rhythm: Regular rhythm. Tachycardia present.     Heart sounds: Normal heart sounds.  Pulmonary:     Effort: Pulmonary effort is normal.     Breath sounds: Normal breath sounds. No wheezing.  Abdominal:     General: Abdomen is protuberant. Bowel sounds are normal.     Palpations: Abdomen is soft. There is no mass.     Tenderness: There is generalized abdominal tenderness. There is no guarding or rebound.     Comments: Mild generalized ttp, no guarding or rebound.  Musculoskeletal: Normal range of motion.  Skin:    General: Skin is warm and dry.  Neurological:     Mental Status: He is alert.      ED Treatments /  Results  Labs (all labs ordered are listed, but only abnormal results are displayed) Labs Reviewed  COMPREHENSIVE METABOLIC PANEL - Abnormal; Notable for the following components:      Result Value   Glucose, Bld 103 (*)    Calcium 8.8 (*)    ALT 45 (*)    All other components within normal limits  CBC WITH DIFFERENTIAL/PLATELET  URINALYSIS, ROUTINE W REFLEX MICROSCOPIC    EKG None  Radiology No results found.  Procedures Procedures (including critical care time)  Medications Ordered in ED Medications  sodium chloride 0.9 % bolus 1,000 mL (0 mLs Intravenous Stopped 09/19/18 1738)  ondansetron (ZOFRAN) injection 4 mg (4 mg Intravenous Given 09/19/18 1635)     Initial Impression / Assessment and Plan / ED Course  I have reviewed the triage vital signs and the nursing notes.  Pertinent labs & imaging results that were available during my care of the patient were reviewed by me and considered in my medical decision making (see chart for details).        Labs reviewed with no significant abnormalities.  Pt had no v/d here.  He was given zofran and tolerated PO intake without worsened sx.  Suspect this is a viral gastroenteritis.  No fever. No risk factors for bacterial source, son with similar sx. He was prescribed zofran for home use. Advised imodium prn . B.r.a.t. diet discussed.  Prn f/u anticipated.   Final Clinical Impressions(s) / ED Diagnoses   Final diagnoses:  Nausea vomiting and diarrhea    ED Discharge Orders         Ordered    ondansetron (ZOFRAN ODT) 4 MG disintegrating tablet  Every 8 hours PRN     09/19/18 1752    loperamide (IMODIUM) 2 MG capsule  4 times daily PRN     09/19/18 1752           Burgess Amor, PA-C 09/19/18 1757    Samuel Jester, DO 09/21/18 2233

## 2018-09-19 NOTE — ED Notes (Signed)
No distress  Has not been to [facility]  Sipping water

## 2018-09-19 NOTE — ED Notes (Signed)
Pt given urinal for UA

## 2018-09-19 NOTE — Discharge Instructions (Addendum)
Your labs are stable today. You may take the zofran as prescribed for control of nausea and vomiting.  You may also consider taking imodium if your diarrhea persists.  Make sure you are drinking plenty of fluids.  You may want to consider the b.r.a.t. diet (for your son as well) which includes bananas, rice, applesauce and dry toast, foods that tend to help with vomiting and diarrhea.

## 2018-09-19 NOTE — ED Notes (Signed)
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

## 2018-11-12 ENCOUNTER — Telehealth: Payer: Self-pay

## 2018-11-12 DIAGNOSIS — Z20822 Contact with and (suspected) exposure to covid-19: Secondary | ICD-10-CM

## 2018-11-12 NOTE — Telephone Encounter (Signed)
Rec'd request from [facility] To schedule pt. For COVID 19 testing due to known exposure to COVID.  [facility]  ph. # [phone]; fax # [phone].  Phone call to pt.  Spoke with wife.  Scheduled appt. For COVID testing 11/13/18 @ 8:45 AM.  Durward Fortes that pt. Should wear a mask, remain in car, and drive up to testing site.  Wife verb. Understanding.

## 2018-11-13 ENCOUNTER — Other Ambulatory Visit: Payer: 59

## 2018-11-13 DIAGNOSIS — Z20822 Contact with and (suspected) exposure to covid-19: Secondary | ICD-10-CM

## 2018-11-15 LAB — NOVEL CORONAVIRUS, NAA: SARS-CoV-2, NAA: NOT DETECTED

## 2019-01-01 ENCOUNTER — Encounter (HOSPITAL_COMMUNITY): Payer: Self-pay | Admitting: Emergency Medicine

## 2019-01-01 ENCOUNTER — Other Ambulatory Visit: Payer: Self-pay

## 2019-01-01 ENCOUNTER — Emergency Department (HOSPITAL_COMMUNITY)
Admission: EM | Admit: 2019-01-01 | Discharge: 2019-01-02 | Disposition: A | Discharge status: Home / Self Care | Payer: 59 | Attending: Emergency Medicine | Admitting: Emergency Medicine

## 2019-01-01 DIAGNOSIS — Y929 Unspecified place or not applicable: Secondary | ICD-10-CM | POA: Diagnosis not present

## 2019-01-01 DIAGNOSIS — W57XXXA Bitten or stung by nonvenomous insect and other nonvenomous arthropods, initial encounter: Secondary | ICD-10-CM | POA: Diagnosis not present

## 2019-01-01 DIAGNOSIS — Z87891 Personal history of nicotine dependence: Secondary | ICD-10-CM | POA: Insufficient documentation

## 2019-01-01 DIAGNOSIS — Y939 Activity, unspecified: Secondary | ICD-10-CM | POA: Diagnosis not present

## 2019-01-01 DIAGNOSIS — S50862A Insect bite (nonvenomous) of left forearm, initial encounter: Secondary | ICD-10-CM | POA: Insufficient documentation

## 2019-01-01 DIAGNOSIS — Y999 Unspecified external cause status: Secondary | ICD-10-CM | POA: Diagnosis not present

## 2019-01-01 DIAGNOSIS — R2232 Localized swelling, mass and lump, left upper limb: Secondary | ICD-10-CM | POA: Diagnosis present

## 2019-01-01 MED ORDER — DOXYCYCLINE HYCLATE 100 MG PO CAPS: 100.0000 mg | ORAL_CAPSULE | Freq: Two times a day (BID) | ORAL | 0 refills | Status: AC

## 2019-01-01 MED ORDER — DOXYCYCLINE HYCLATE 100 MG PO TABS
100.0000 mg | ORAL_TABLET | Freq: Once | ORAL | Status: AC
  Administered 2019-01-01: 100 mg via ORAL
  Filled 2019-01-01: qty 1

## 2019-01-01 NOTE — Discharge Instructions (Addendum)
As discussed, apply warm wet compresses on and off to your arm at least 2-3 times a day.  Avoid squeezing.  Take the antibiotic as directed until its finished.  Try to avoid using your left arm and elevate on pillows when possible.  Return to the ER for any worsening symptoms such as fever, chills, increasing pain or increasing redness or red streaking

## 2019-01-01 NOTE — ED Provider Notes (Signed)
[facility] EMERGENCY DEPARTMENT Provider Note   CSN: [id number] Arrival date & time: 01/01/19  2013     History   Chief Complaint Chief Complaint  Patient presents with  . Abscess    HPI Philip Bird is a 23 y.o. male.     HPI   Philip Bird is a 23 y.o. male who presents to the Emergency Department complaining of painful red "bump" to the inner left forearm.  He states he noticed this area yesterday.  He thought this was a abscess, he squeezed it without significant return of pus or blood.  Today, he noticed increasing pain and swelling at the site prompting his ER arrival.  He denies pain with movement of his arm, fever, chills, numbness or tingling of the extremity or body aches.  He has not tried any over-the-counter medications.  He states he believes he was bitten by some sort of insect but he is not sure this occurred.  No history of drug use or recent new medications.    History reviewed. No pertinent past medical history.  There are no active problems to display for this patient.   Past Surgical History:  Procedure Laterality Date  . BRAIN SURGERY    . CRANIOTOMY Right 2012  . FRACTURE SURGERY    . RADIAL STYLOIDECTOMY WRIST  2012      Home Medications    Prior to Admission medications   Not on File    Family History No family history on file.  Social History Social History   Tobacco Use  . Smoking status: Former Smoker    Types: Cigarettes  . Smokeless tobacco: Never Used  Substance Use Topics  . Alcohol use: Yes    Comment: rarely  . Drug use: No     Allergies   Penicillins   Review of Systems Review of Systems  Constitutional: Negative for chills and fever.  Respiratory: Negative for chest tightness, shortness of breath and wheezing.   Cardiovascular: Negative for chest pain.  Musculoskeletal: Negative for arthralgias, joint swelling, myalgias and neck pain.  Skin: Positive for color change (painful red bump to inner left  forearm). Negative for wound.  Neurological: Negative for weakness, numbness and headaches.     Physical Exam Updated Vital Signs BP 124/87   Pulse 72   Temp 98.3 F (36.8 C) (Oral)   Resp 16   Ht 6' (1.829 m)   Wt 124.7 kg   SpO2 99%   BMI 37.30 kg/m   Physical Exam Vitals signs and nursing note reviewed.  Constitutional:      General: He is not in acute distress.    Appearance: Normal appearance. He is not toxic-appearing.  HENT:     Head: Atraumatic.  Neck:     Musculoskeletal: Normal range of motion. No muscular tenderness.  Cardiovascular:     Rate and Rhythm: Normal rate and regular rhythm.     Pulses: Normal pulses.  Pulmonary:     Effort: Pulmonary effort is normal.     Breath sounds: Normal breath sounds.  Chest:     Chest wall: No tenderness.  Musculoskeletal: Normal range of motion.        General: No swelling or tenderness.     Comments: Patient has full range of motion without tenderness of the left shoulder, elbow and wrist.  Lymphadenopathy:     Cervical: No cervical adenopathy.  Skin:    General: Skin is warm.     Capillary Refill: Capillary refill takes less  than 2 seconds.     Findings: Erythema present.     Comments: 1 cm papule to the inner left forearm with 6 cm of surrounding erythema.  No induration or fluctuance.  Compartments are soft.  No skin changes or track marks to the antecubital fossa.  Neurological:     General: No focal deficit present.     Mental Status: He is alert.      ED Treatments / Results  Labs (all labs ordered are listed, but only abnormal results are displayed) Labs Reviewed - No data to display  EKG None  Radiology No results found.  Procedures Procedures (including critical care time)  Medications Ordered in ED Medications  doxycycline (VIBRA-TABS) tablet 100 mg (100 mg Oral Given 01/01/19 2338)     Initial Impression / Assessment and Plan / ED Course  I have reviewed the triage vital signs and the  nursing notes.  Pertinent labs & imaging results that were available during my care of the patient were reviewed by me and considered in my medical decision making (see chart for details).        Patient well-appearing.  Nontoxic.  Vital signs reviewed.  He has a small papule present to the left inner forearm with surrounding cellulitis.  Area does appear consistent with insect bite and possibly developing abscess.  No indication for I&D at this time.  No clinical suspicion for IV drug use.  Patient appears appropriate for discharge home, he agrees to warm compresses to the affected area, antibiotics, and close follow-up.  Leading edges of erythema was marked by me.  Patient was given strict return precautions.  Final Clinical Impressions(s) / ED Diagnoses   Final diagnoses:  Bug bite with infection, initial encounter    ED Discharge Orders    None       Kem Parkinson, PA-C 01/01/19 2354    Fredia Sorrow, MD 01/07/19 [phone]

## 2019-01-01 NOTE — ED Triage Notes (Signed)
Pt c/o abscess to left forearm since yesterday

## 2019-12-01 ENCOUNTER — Ambulatory Visit: Payer: 59 | Admitting: Urology

## 2019-12-31 DIAGNOSIS — Z3009 Encounter for other general counseling and advice on contraception: Secondary | ICD-10-CM | POA: Insufficient documentation

## 2019-12-31 NOTE — Progress Notes (Signed)
Subjective: 1. Consultation for sterilization     Philip Bird is a 24 yo male who presents for a vasectomy consultation. He has two kids.   He has had a prior suprapubic abscess no other GU history.  ROS:  ROS  Allergies  Allergen Reactions  . Penicillins     .Did it involve swelling of the face/tongue/throat, SOB, or low BP? Unknown Did it involve sudden or severe rash/hives, skin peeling, or any reaction on the inside of your mouth or nose?UUnknown Did you need to seek medical attention at a hospital or doctor's office?No When did it last happen? If all above answers are "NO", may proceed with cephalosporin use.    Past Medical History:  Diagnosis Date  . TBI (traumatic brain injury) (HCC) 2012    Past Surgical History:  Procedure Laterality Date  . BRAIN SURGERY    . CRANIOTOMY Right 2012  . FRACTURE SURGERY    . RADIAL STYLOIDECTOMY WRIST  2012    Social History   Socioeconomic History  . Marital status: Married    Spouse name: Not on file  . Number of children: Not on file  . Years of education: Not on file  . Highest education level: Not on file  Occupational History  . Occupation: Warehouse manager  Tobacco Use  . Smoking status: Current Every Day Smoker    Packs/day: 1.00    Types: Cigarettes  . Smokeless tobacco: Never Used  Vaping Use  . Vaping Use: Never used  Substance and Sexual Activity  . Alcohol use: Yes    Comment: rarely  . Drug use: No  . Sexual activity: Not on file  Other Topics Concern  . Not on file  Social History Narrative  . Not on file   Social Determinants of Health   Financial Resource Strain:   . Difficulty of Paying Living Expenses:   Food Insecurity:   . Worried About Programme researcher, broadcasting/film/video in the Last Year:   . Barista in the Last Year:   Transportation Needs:   . Freight forwarder (Medical):   Marland Kitchen Lack of Transportation (Non-Medical):   Physical Activity:   . Days of Exercise per Week:   . Minutes of  Exercise per Session:   Stress:   . Feeling of Stress :   Social Connections:   . Frequency of Communication with Friends and Family:   . Frequency of Social Gatherings with Friends and Family:   . Attends Religious Services:   . Active Member of Clubs or Organizations:   . Attends Banker Meetings:   Marland Kitchen Marital Status:   Intimate Partner Violence:   . Fear of Current or Ex-Partner:   . Emotionally Abused:   Marland Kitchen Physically Abused:   . Sexually Abused:     Family History  Problem Relation Age of Onset  . AAA (abdominal aortic aneurysm) Neg Hx     Anti-infectives: Anti-infectives (From admission, onward)   None      Current Outpatient Medications  Medication Sig Dispense Refill  . diazepam (VALIUM) 10 MG tablet Take 1 po an hour prior to the procedure and a second tab prior if needed. 2 tablet 0  . doxycycline (VIBRAMYCIN) 100 MG capsule Take 1 capsule (100 mg total) by mouth 2 (two) times daily. (Patient not taking: Reported on 01/01/2020) 20 capsule 0   No current facility-administered medications for this visit.     Objective: Vital signs in last 24 hours: BP Marland Kitchen)  130/82   Pulse 103   Temp 99 F (37.2 C)   Ht 6' (1.829 m)   Wt (!) 271 lb (122.9 kg)   BMI 36.75 kg/m   Intake/Output from previous day: No intake/output data recorded. Intake/Output this shift: @IOTHISSHIFT @   Physical Exam Vitals reviewed.  Constitutional:      Appearance: Normal appearance. He is obese.  Genitourinary:    Comments: Normal phallus with adequate meatus. Scrotum normal with bilateral vas palpable. Testes and epididymis normal.  Neurological:     Mental Status: He is alert.     Lab Results:  No results found for this or any previous visit (from the past 24 hour(s)).  BMET No results for input(s): NA, K, CL, CO2, GLUCOSE, BUN, CREATININE, CALCIUM in the last 72 hours. PT/INR No results for input(s): LABPROT, INR in the last 72 hours. ABG No results for  input(s): PHART, HCO3 in the last 72 hours.  Invalid input(s): PCO2, PO2  Studies/Results: No results found.   Assessment/Plan: Consultation for sterilization He is a good candidate for a vasectomy.   I have reviewed the risks of bleeding, infection, testicular atrophy, chronic pain, mood disorders and failure of the procedure.   Meds ordered this encounter  Medications  . diazepam (VALIUM) 10 MG tablet    Sig: Take 1 po an hour prior to the procedure and a second tab prior if needed.    Dispense:  2 tablet    Refill:  0     No orders of the defined types were placed in this encounter.    Return for Next available vasectomy.         01/01/2020 [phone]

## 2020-01-01 ENCOUNTER — Encounter: Payer: Self-pay | Admitting: Urology

## 2020-01-01 ENCOUNTER — Other Ambulatory Visit: Payer: Self-pay

## 2020-01-01 ENCOUNTER — Ambulatory Visit (INDEPENDENT_AMBULATORY_CARE_PROVIDER_SITE_OTHER): Payer: 59 | Admitting: Urology

## 2020-01-01 VITALS — BP 130/82 | HR 103 | Temp 99.0°F | Ht 72.0 in | Wt 271.0 lb

## 2020-01-01 DIAGNOSIS — Z3009 Encounter for other general counseling and advice on contraception: Secondary | ICD-10-CM | POA: Diagnosis not present

## 2020-01-01 MED ORDER — DIAZEPAM 10 MG PO TABS: ORAL_TABLET | ORAL | 0 refills | Status: AC

## 2020-01-01 NOTE — Progress Notes (Signed)

## 2020-01-01 NOTE — Assessment & Plan Note (Signed)
He is a good candidate for a vasectomy.   I have reviewed the risks of bleeding, infection, testicular atrophy, chronic pain, mood disorders and failure of the procedure.

## 2020-02-05 ENCOUNTER — Other Ambulatory Visit: Payer: Self-pay

## 2020-02-05 ENCOUNTER — Encounter: Payer: Self-pay | Admitting: Urology

## 2020-02-05 ENCOUNTER — Ambulatory Visit (INDEPENDENT_AMBULATORY_CARE_PROVIDER_SITE_OTHER): Payer: 59 | Admitting: Urology

## 2020-02-05 VITALS — BP 128/84 | HR 87 | Temp 98.5°F | Ht 72.0 in | Wt 275.0 lb

## 2020-02-05 DIAGNOSIS — Z302 Encounter for sterilization: Secondary | ICD-10-CM

## 2020-02-05 NOTE — Progress Notes (Signed)
02/05/20  CC: I am here for a vasectomy.  HPI: Kwame returns today for a vasectomy.     Bilateral Vasectomy Procedure  Pre-Procedure: - He had taken valium as instructed.  - Patient's scrotum was prepped and draped for vasectomy. - The vas was palpated through the scrotal skin on the left. - 1% Xylocaine was injected into the skin and surrounding tissue for placement  - In a similar manner, the vas on the right was identified, anesthetized, and stabilized.  Procedure: - A bladeless technique was used to open the overlying skin (sharp dissection) - The left vas was isolated and brought up through the incision exposing that structure. - Bleeding points were cauterized as they occurred. - The vas was free from the surrounding structures and brought to the view. - A segment was positioned for placement with a hemostat. - A second hemostat was placed and a small segment between the two hemostats and was removed for inspection. - Each end of the transected vas lumen was fulgurated/ obliterated using needlepoint electrocautery - A fascial interposition was performed on testicular end of the vas using #3-0 chromic suture - The same procedure was performed on the right. - A dressing was applied.  Post-Procedure: - Patient was instructed in care of the operative area - A specimen is to be delivered in 12 weeks   -Another form of contraception is to be used until post vasectomy semen analysis  Bjorn Pippin, MD

## 2020-02-05 NOTE — Patient Instructions (Signed)
Vasectomy Postoperative Instructions  Please bring back a semen analysis in approximately 3 months.  You will be given a sterile specimen cup. Please label the cup with your name, date of birth, date and time of collections.  What to Expect  - slight redness, swelling and scant drainage along the incision  - mild to moderate discomfort  - black and blue (bruising) as the tissue heals  - low grade fever  - scrotal sensitivity and/or tenderness - Edges of the incision may pull apart and heal slowly, sometimes a knot may be present which remains for several months.  This is NORMAL and all part of the healing process. - if stitches are placed, they do not need to be removed - if you have pain or discomfort immediately after the vasectomy, you may use OTC pain medication for relief , ex: tylenol.  After local anesthetic wears off an ice pack will provide additional comfort and can also prevent swelling if used  Activity  - no sexual intercourse for at lease 5 days depending on comfort  - no heavy lifting for 48-72 hours (anything over 5-10 lbs)  Wound Care  - shower only after 24 hours  - no tub baths, hot tub, or pools for at least 7 days  - ice packs for 48 hours: 30 minutes on and 30 minutes off  Problem to Report  - generalized redness  - increased pain and swelling  - fever greater than 101 F  - significant drainage or bleeding from the wound  TO DO - Ejaculations help to clear the passage of sperm, but you must use another from of birth control until you are told you may discontinue its use!! - You will be given a specimen cup to bring back a semen sample in 3 months to check and see if its clear of sperm.  Only after the semen is sent for analysis and is reported back as clear should you use this as your primary form of birth control!

## 2020-04-22 ENCOUNTER — Other Ambulatory Visit: Payer: Self-pay

## 2020-04-22 DIAGNOSIS — Z302 Encounter for sterilization: Secondary | ICD-10-CM

## 2020-04-22 DIAGNOSIS — Z9852 Vasectomy status: Secondary | ICD-10-CM

## 2020-05-06 ENCOUNTER — Ambulatory Visit: Payer: 59 | Admitting: Urology

## 2020-05-06 ENCOUNTER — Other Ambulatory Visit: Payer: 59

## 2020-06-08 ENCOUNTER — Telehealth: Payer: Self-pay

## 2020-06-08 LAB — SEMEN ANALYSIS, BASIC
Appearance: NORMAL
Time Collected: 1230
Time Received: 1300
Time Since Last Emission: 3 days
Volume: 2 mL (ref >1.4)
pH: 8 (ref >7.1)

## 2020-06-08 NOTE — Telephone Encounter (Signed)
-----   Message from Bjorn Pippin, MD sent at 06/08/2020  5:08 PM EST ----- Semen is clear.  Ok to rely on vasectomy.

## 2020-06-08 NOTE — Telephone Encounter (Signed)
Called pt to tell of semen analysis result. Lft message.

## 2020-06-09 NOTE — Telephone Encounter (Signed)
Pts wife notified of semen analysis results.

## 2020-06-09 NOTE — Telephone Encounter (Signed)
-----   Message from John Wrenn, MD sent at 06/08/2020  5:08 PM EST ----- Semen is clear.  Ok to rely on vasectomy.
